# Patient Record
Sex: Female | Born: 1970 | Race: White | Hispanic: No | Marital: Single
Health system: Southern US, Community
[De-identification: ages and names within clinical notes are randomized; demographics above are authoritative.]

---

## 1997-08-29 ENCOUNTER — Ambulatory Visit (HOSPITAL_COMMUNITY): Admission: RE | Admit: 1997-08-29 | Discharge: 1997-08-29 | Payer: Self-pay | Admitting: Obstetrics and Gynecology

## 1998-02-13 ENCOUNTER — Inpatient Hospital Stay (HOSPITAL_COMMUNITY): Admission: AD | Admit: 1998-02-13 | Discharge: 1998-02-13 | Payer: Self-pay | Admitting: Obstetrics and Gynecology

## 1998-05-22 ENCOUNTER — Inpatient Hospital Stay (HOSPITAL_COMMUNITY): Admission: AD | Admit: 1998-05-22 | Discharge: 1998-05-24 | Payer: Self-pay | Admitting: Obstetrics and Gynecology

## 1999-09-23 ENCOUNTER — Other Ambulatory Visit: Admission: RE | Admit: 1999-09-23 | Discharge: 1999-09-23 | Payer: Self-pay | Admitting: Obstetrics and Gynecology

## 2000-01-30 ENCOUNTER — Encounter: Payer: Self-pay | Admitting: Obstetrics and Gynecology

## 2000-01-30 ENCOUNTER — Inpatient Hospital Stay: Admission: AD | Admit: 2000-01-30 | Discharge: 2000-01-30 | Payer: Self-pay | Admitting: Obstetrics and Gynecology

## 2001-01-12 ENCOUNTER — Other Ambulatory Visit: Admission: RE | Admit: 2001-01-12 | Discharge: 2001-01-12 | Payer: Self-pay | Admitting: Obstetrics and Gynecology

## 2001-07-18 ENCOUNTER — Inpatient Hospital Stay (HOSPITAL_COMMUNITY): Admission: AD | Admit: 2001-07-18 | Discharge: 2001-07-20 | Payer: Self-pay | Admitting: Obstetrics and Gynecology

## 2002-09-13 ENCOUNTER — Other Ambulatory Visit: Admission: RE | Admit: 2002-09-13 | Discharge: 2002-09-13 | Payer: Self-pay | Admitting: Obstetrics and Gynecology

## 2003-09-25 ENCOUNTER — Other Ambulatory Visit: Admission: RE | Admit: 2003-09-25 | Discharge: 2003-09-25 | Payer: Self-pay | Admitting: Obstetrics and Gynecology

## 2004-06-13 ENCOUNTER — Encounter: Admission: RE | Admit: 2004-06-13 | Discharge: 2004-06-13 | Payer: Self-pay | Admitting: Family Medicine

## 2004-10-01 ENCOUNTER — Other Ambulatory Visit: Admission: RE | Admit: 2004-10-01 | Discharge: 2004-10-01 | Payer: Self-pay | Admitting: Obstetrics and Gynecology

## 2006-01-04 ENCOUNTER — Other Ambulatory Visit: Admission: RE | Admit: 2006-01-04 | Discharge: 2006-01-04 | Payer: Self-pay | Admitting: Obstetrics and Gynecology

## 2007-01-10 ENCOUNTER — Other Ambulatory Visit: Admission: RE | Admit: 2007-01-10 | Discharge: 2007-01-10 | Payer: Self-pay | Admitting: Obstetrics and Gynecology

## 2008-01-12 ENCOUNTER — Other Ambulatory Visit: Admission: RE | Admit: 2008-01-12 | Discharge: 2008-01-12 | Payer: Self-pay | Admitting: Obstetrics and Gynecology

## 2008-03-08 ENCOUNTER — Other Ambulatory Visit: Admission: RE | Admit: 2008-03-08 | Discharge: 2008-03-08 | Payer: Self-pay | Admitting: Obstetrics and Gynecology

## 2008-11-28 ENCOUNTER — Ambulatory Visit: Payer: Self-pay | Admitting: Sports Medicine

## 2008-11-28 DIAGNOSIS — T148XXA Other injury of unspecified body region, initial encounter: Secondary | ICD-10-CM

## 2008-11-28 DIAGNOSIS — M21619 Bunion of unspecified foot: Secondary | ICD-10-CM

## 2008-11-28 DIAGNOSIS — M25559 Pain in unspecified hip: Secondary | ICD-10-CM

## 2009-01-02 ENCOUNTER — Ambulatory Visit: Payer: Self-pay | Admitting: Sports Medicine

## 2009-02-01 ENCOUNTER — Ambulatory Visit: Payer: Self-pay | Admitting: Sports Medicine

## 2009-02-06 ENCOUNTER — Telehealth: Payer: Self-pay | Admitting: Family Medicine

## 2009-02-08 ENCOUNTER — Ambulatory Visit: Payer: Self-pay | Admitting: Family Medicine

## 2009-02-11 ENCOUNTER — Telehealth: Payer: Self-pay | Admitting: Family Medicine

## 2009-02-18 ENCOUNTER — Telehealth: Payer: Self-pay | Admitting: Family Medicine

## 2009-02-20 ENCOUNTER — Telehealth: Payer: Self-pay | Admitting: Family Medicine

## 2009-02-26 ENCOUNTER — Encounter: Payer: Self-pay | Admitting: Sports Medicine

## 2009-04-24 ENCOUNTER — Ambulatory Visit: Payer: Self-pay | Admitting: Sports Medicine

## 2009-04-24 DIAGNOSIS — M216X9 Other acquired deformities of unspecified foot: Secondary | ICD-10-CM

## 2009-08-29 ENCOUNTER — Ambulatory Visit: Payer: Self-pay | Admitting: Sports Medicine

## 2009-08-29 DIAGNOSIS — M545 Low back pain: Secondary | ICD-10-CM

## 2009-09-26 ENCOUNTER — Encounter (INDEPENDENT_AMBULATORY_CARE_PROVIDER_SITE_OTHER): Payer: Self-pay | Admitting: *Deleted

## 2009-09-26 ENCOUNTER — Encounter: Payer: Self-pay | Admitting: Sports Medicine

## 2009-10-03 ENCOUNTER — Encounter: Payer: Self-pay | Admitting: Sports Medicine

## 2009-10-04 ENCOUNTER — Encounter: Payer: Self-pay | Admitting: Sports Medicine

## 2009-10-08 ENCOUNTER — Ambulatory Visit: Payer: Self-pay | Admitting: Sports Medicine

## 2009-10-08 DIAGNOSIS — M5126 Other intervertebral disc displacement, lumbar region: Secondary | ICD-10-CM

## 2009-10-09 ENCOUNTER — Encounter (INDEPENDENT_AMBULATORY_CARE_PROVIDER_SITE_OTHER): Payer: Self-pay | Admitting: *Deleted

## 2009-10-17 ENCOUNTER — Ambulatory Visit: Payer: Self-pay | Admitting: Sports Medicine

## 2009-10-23 ENCOUNTER — Encounter: Payer: Self-pay | Admitting: Sports Medicine

## 2009-10-25 ENCOUNTER — Encounter: Payer: Self-pay | Admitting: Sports Medicine

## 2009-10-26 ENCOUNTER — Ambulatory Visit (HOSPITAL_COMMUNITY): Admission: RE | Admit: 2009-10-26 | Discharge: 2009-10-26 | Payer: Self-pay | Admitting: Neurosurgery

## 2009-11-14 ENCOUNTER — Encounter: Payer: Self-pay | Admitting: Sports Medicine

## 2010-04-15 ENCOUNTER — Encounter: Payer: Self-pay | Admitting: Sports Medicine

## 2010-04-25 ENCOUNTER — Ambulatory Visit (HOSPITAL_BASED_OUTPATIENT_CLINIC_OR_DEPARTMENT_OTHER): Admission: RE | Admit: 2010-04-25 | Discharge: 2010-04-25 | Payer: Self-pay | Admitting: Orthopedic Surgery

## 2010-08-03 ENCOUNTER — Encounter: Payer: Self-pay | Admitting: Sports Medicine

## 2010-08-14 NOTE — Letter (Signed)
Summary: Out of Work  [facility]  [address]   [place], [place] [address]   Phone: [phone]  Fax: [phone]    October 09, 2009   Employee:  Kathryn Bryant    To Whom It May Concern:   For Medical reasons, please excuse the above named employee from work for the following restrictions:    *No lifting over 10lbs   *Limit the amount of bending over or backwards   *Allow seating often; No continuous standing for longer than 1 hour  If you need additional information, please feel free to contact our office.         Sincerely,   Sibyl Parr. Jettie Booze, M.D Lillia Pauls CMA

## 2010-08-14 NOTE — Miscellaneous (Signed)
Summary: NEW PT APPT  NEW PT APPT IS WITH [facility] ON [address] 3.28.11 AT 2:15. PT TO ARRIVE AT 1:45 FOR NEW PT INFO FORMS TO BE FILLED OUT. [phone]

## 2010-08-14 NOTE — Consult Note (Signed)
Summary: [facility]  [facility]   Imported By: Knox Royalty 10/30/2009 16:02:49  _____________________________________________________________________  External Attachment:    Type:   Image     Comment:   External Document

## 2010-08-14 NOTE — Assessment & Plan Note (Signed)
Summary: 2:45,F/U,MC   Vital Signs:  Patient profile:   40 year old female BP sitting:   108 / 74  Vitals Entered By: Lillia Pauls CMA (October 17, 2009 3:04 PM)  History of Present Illness: Pt presents for follow-up of back pain with radicular symptoms down her right leg. She has had an MRI which showed a herniated disc at L4/L5.  She has taken the prednisone and felt good for the last 2 mornings while she was taking the prednisone but has unfortunately had her pain return. Walking makes her pain worse but she is able to sleep but only with a heating pad. She does get some benefit from the tramadol but has recently run out of it. She has not taken the vicodin and she does not beleive tha tthe neurontin has been helpful because it typically just makes her sleepy.  Overall, she feels like she has not had any improvement since her last office visit.   Allergies: No Known Drug Allergies  Physical Exam  General:  alert and well-developed.   Head:  normocephalic and atraumatic.   Neck:  supple.   Lungs:  normal respiratory effort.   Msk:  Back: No bony abnormalities, edema or bruising Forward flexion to 90 degrees but painful. Can only do about 10-15 degrees of back extension because of pain. 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 Take one tab po qid  Complete Medication List: 1)  Tramadol Hcl 50 Mg Tabs (Tramadol hcl) .... Take one tab po qid 2)  Gabapentin 600 Mg Tabs (Gabapentin) .Marland Kitchen.. 1 by mouth tid  Patient Instructions: 1)  Use the tramadol 4 times daily 2)  Use the higher dose of gabapentin 3 times per day 3)  OK to use supplemental aleve or ibuprofen 4)  heat 5)  activity to tolerance but limit lifting or painful activity Prescriptions: GABAPENTIN 600 MG TABS (GABAPENTIN) 1 by mouth tid  #90 x 2   Entered and Authorized by:   Enid Baas MD   Signed by:   Enid Baas MD on 10/17/2009   Method used:   Electronically to        [facility]. [phone]* (retail)       [address]       [place], [place]  [address]       Ph: [phone]       Fax: [phone]   RxID:   [id number] TRAMADOL HCL 50 MG TABS (TRAMADOL HCL) take one tab po qid  #120 x 2   Entered by:   Jannifer Rodney MD   Authorized by:  Enid Baas MD   Signed by:   Enid Baas MD on 10/17/2009   Method used:   Electronically to        [facility]. [phone]* (retail)       [address]       [place], [place]  [address]       Ph: [phone]       Fax: [phone]   RxID:   [id number]

## 2010-08-14 NOTE — Letter (Signed)
Summary: Out of Work  [facility]  [address]   [place], [place] [address]   Phone: [phone]  Fax: [phone]    October 17, 2009   Employee:  Kathryn Bryant    To Whom It May Concern:   For Medical reasons, please excuse the above named employee from work for the following dates:  Start:   10/17/09  End:   11/15/09  If you need additional information, please feel free to contact our office.         Sincerely,      Enid Baas MD

## 2010-08-14 NOTE — Consult Note (Signed)
Summary: [facility]  [facility]   Imported By: Marily Memos 04/29/2010 15:55:08  _____________________________________________________________________  External Attachment:    Type:   Image     Comment:   External Document

## 2010-08-14 NOTE — Letter (Signed)
Summary: [employer] [facility]  [facility] [facility]   Imported By: Marily Memos 10/03/2009 11:23:49  _____________________________________________________________________  External Attachment:    Type:   Image     Comment:   External Document

## 2010-08-14 NOTE — Consult Note (Signed)
Summary: Kathryn Bryant   Imported By: Marily Memos 11/26/2009 15:41:07  _____________________________________________________________________  External Attachment:    Type:   Image     Comment:   External Document

## 2010-08-14 NOTE — Assessment & Plan Note (Signed)
Summary: LOWER BACK,HIP INJURY,MC   Vital Signs:  Patient profile:   40 year old female Height:      66 inches Weight:      133 pounds BMI:     21.54 BP sitting:   100 / 70  Vitals Entered By: Lillia Pauls CMA (August 29, 2009 9:16 AM)  History of Present Illness: 40 y/o F here for low back pain.  Patient states prior hip issues had improved significantly About 1 1/2 weeks ago was playing 'hug football' at school with kids (hugging to tackle someone) - hugged one of the kids and he kept running - felt a twinge in mid-right low back. Next 2 days went to chiropractor for manipulation and improved - also went last Saturday. Last 2 days working new job on feet constantly and pain seemed worse - difficult to get comfortable Some tingling down into upper right thigh and occasionally into toes on right. Pain worse with bending and extension. No bowel/bladder issues. Took 3 motrin this morning but does not like taking medications.  Allergies (verified): No Known Drug Allergies  Physical Exam  General:  Well-developed,well-nourished,in no acute distress; alert,appropriate and cooperative throughout examination Msk:  Back Exam: Inspection: no gross deformity Motion: FROM but pain at 70 deg flexion and 10 deg extension SLR seated: neg - pain in back only XSLR seated: neg - pain in back only Palpable tenderness: right paraspinal lumbar region with spasm and deep in right buttock FABER: negative.  + piriformis stretch on right. Sensory change: none currently Reflex change: equal in patellar and achilles tendons.  Strength at foot Plantar-flexion: 5 / 5    Dorsi-flexion: 5 / 5    Eversion: 5 / 5   Inversion: 5 / 5 Leg strength Quad: 5 / 5   Hamstring: 5 / 5   Hip flexor: 5 / 5  Gait normal but standing upright leans to left side for comfort.   Impression & Recommendations:  Problem # 1:  LUMBAGO (ICD-724.2) Assessment New Believe patient has discogenic component to low back  pain with radiation into right upper thigh and c/o numbness.  Also with + piriformis stretch but pain originated just right of midline in back.  Start flexion-based stretches and strengthening exercises, to [facility] with modalities as needed.  Neurontin for nerve irritation.  Aleve in addition to this.  Exercise as tolerated - avoid extension which would load discs.  Complete Medication List: 1)  Neurontin 300 Mg Caps (Gabapentin) .... Take 1 tab three times a day  Patient Instructions: 1)  Do the flexion based exercises/stretches as directed. 2)  Start physical therapy 1-2 times a week for 4 weeks. 3)  Take neurontin 300mg  at bedtime - you can increase after a week to twice a day then to three times a day if tolerated to help with nerve irritation. 4)  Aleve 1-2 tabs twice a day with food for pain and inflammation. 5)  Follow up with [place] in 1 month. Prescriptions: NEURONTIN 300 MG CAPS (GABAPENTIN) Take 1 tab three times a day  #90 x 1   Entered and Authorized by:   Norton Blizzard MD   Signed by:   Norton Blizzard MD on 08/29/2009   Method used:   Print then Give to Patient   RxID:   [id number]   Appended Document: LOWER Dorian Heckle discussed by phone that we think is probably discogenic pain; standing is still making worse   OK to keep  on therapeutic dose of advil along with neurontin and if pain not lessening consider stronger med.  REck if worsening.  Appended Document: LOWER BACK,HIP INJURY,MC   Appended Document: LOWER BACK,HIP INJURY,MC APPT FOR MRI IS ON SAT, MARCH 26TH AT 3:30 AT THE [facility] [address]. [id number]. PT INFORMED AND PRIOR AUTH FORM FILLED OUT AND ORDER FAXED  Appended Document: LOWER BACK,HIP INJURY,MC DUE TO THE COST OF THE MRI AND [facility], I CHANGED APPT TO [facility] AT [facility] AT 9:30AM

## 2010-08-14 NOTE — Letter (Signed)
Summary: [facility] PT Referral Form  [facility] PT Referral Form   Imported By: Marily Memos 09/27/2009 08:35:20  _____________________________________________________________________  External Attachment:    Type:   Image     Comment:   External Document

## 2010-08-14 NOTE — Letter (Signed)
Summary: [facility]  [facility]   Imported By: Marily Memos 10/24/2009 10:25:54  _____________________________________________________________________  External Attachment:    Type:   Image     Comment:   External Document

## 2010-08-14 NOTE — Assessment & Plan Note (Signed)
Summary: TO SEE Kathryn Bryant PER Lucifer Soja/MJD   History of Present Illness: Patient is seen in followup of discogenic back pain pain has worsened no real weakness but lots of radicular sxs to RT leg no bowel or bladder probs tingling down RT leg with back extension, flex or leaning to RT  stretches help  not much change w neurontin yet not using tramadol x at nite  MRI shows compression of L5 nerve root and L4/5 disc  here for follow up  Allergies: No Known Drug Allergies  Physical Exam  General:  Well-developed,well-nourished,in no acute distress; alert,appropriate and cooperative throughout examination Msk:  able to do neel, toe and tandem walk sans weakness  pain at < 10 deg back extension pain at 70 deg back flexion no pain on left lean pain on RT lean rotation to left norm but to rt stops just past midline  sLR painful on RT Additional Exam:  MRI reviewed w patient and mother paracentral disk herniation on RT obscures L5 nerve root poor hydration of entire L4/5 disk   Impression & Recommendations:  Problem # 1:  LUMBAGO (ICD-724.2)  Her updated medication list for this problem includes:    Tramadol Hcl 50 Mg Tabs (Tramadol hcl) .Marland Kitchen... 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 Take one qam, one midday, and two qpm 2)  Tramadol Hcl 50 Mg Tabs (Tramadol hcl) .... Take one tab bid 3)  Prednisone 20 Mg Tabs (Prednisone) .... Use 1 by mouth three times a day  Patient Instructions: 1)  neurontin - use 2 three times a day or 600 mgm 3  times per day. 2)  Take tramadol every 4 to 6 hours 3)  find exercises that don't hurt and those are OK 4)  particularly bike or arc machine 5)  very easy motion exercises for back - not to point of pain 6)  keep up knee chest 7)  knee to opp shoulder 8)  buy some therawraps and try these 9)  avoid lifting > 10 lbs 10)  limit bending 11)  trial on prednisone for 1 week - 60 mg per day 12)  reck in 9 days Prescriptions: PREDNISONE 20 MG TABS (PREDNISONE) use 1 by mouth three times a day  #21 x 0   Entered by:   Enid Baas MD   Authorized by:   Norton Blizzard MD   Signed by:   Enid Baas MD on 10/08/2009   Method used:   Electronically to        [facility]. [phone]* (retail)       [address]       [place], [place]  [address]       Ph: [phone]       Fax: [phone]   RxID:   [id number]

## 2010-08-14 NOTE — Letter (Signed)
Summary: [facility]  [facility]   Imported By: Marily Memos 10/08/2009 11:38:43  _____________________________________________________________________  External Attachment:    Type:   Image     Comment:   External Document

## 2010-08-14 NOTE — Letter (Signed)
Summary: [employer] Preauth form  [employer] Preauth form   Imported By: Marily Memos 10/03/2009 15:25:41  _____________________________________________________________________  External Attachment:    Type:   Image     Comment:   External Document

## 2010-09-30 LAB — CBC
HCT: 35.7 % — ABNORMAL LOW (ref 36.0–46.0)
Hemoglobin: 13 g/dL (ref 12.0–15.0)
MCHC: 35.9 g/dL (ref 30.0–36.0)
MCV: 85.9 fL (ref 78.0–100.0)
Platelets: 173 10*3/uL (ref 150–400)
RBC: 4.16 MIL/uL (ref 3.87–5.11)
RDW: 13.5 % (ref 11.5–15.5)
WBC: 7.4 10*3/uL (ref 4.0–10.5)

## 2010-09-30 LAB — SURGICAL PCR SCREEN
MRSA, PCR: NEGATIVE
Staphylococcus aureus: NEGATIVE

## 2014-04-17 ENCOUNTER — Telehealth: Payer: Self-pay | Admitting: *Deleted

## 2014-04-17 NOTE — Telephone Encounter (Signed)
OK to removed from recall.  CC:  Kathryn Bryant

## 2014-04-17 NOTE — Telephone Encounter (Signed)
Recall completed, encounter closed. 

## 2014-04-17 NOTE — Telephone Encounter (Signed)
Called pt to schedule AEX and repeat pap. Recall 8.  Patient states she moved out of town and is seeing somebody else now.  OK to remove from recalls? Dr. Hyacinth MeekerMiller  CC: Kennon RoundsSally

## 2019-09-16 ENCOUNTER — Ambulatory Visit: Payer: Self-pay | Attending: Internal Medicine

## 2019-09-16 DIAGNOSIS — Z23 Encounter for immunization: Secondary | ICD-10-CM | POA: Insufficient documentation

## 2019-09-16 NOTE — Progress Notes (Signed)
   Covid-19 Vaccination Clinic  Name:  Kathryn Bryant    MRN: [id number] DOB: Mar 12, 1971  09/16/2019  [name] was observed post Covid-19 immunization for 15 minutes without incident. She was provided with Vaccine Information Sheet and instruction to access the V-Safe system.   [name] was instructed to call 911 with any severe reactions post vaccine: Marland Kitchen Difficulty breathing  . Swelling of face and throat  . A fast heartbeat  . A bad rash all over body  . Dizziness and weakness   Immunizations Administered    Name Date Dose VIS Date Route   Pfizer COVID-19 Vaccine 09/16/2019 12:14 PM 0.3 mL 06/23/2019 Intramuscular   Manufacturer: [employer], [employer]   Lot: [id number]   NDC: [address]-1

## 2019-10-07 ENCOUNTER — Ambulatory Visit: Payer: Self-pay | Attending: Internal Medicine

## 2019-10-07 DIAGNOSIS — Z23 Encounter for immunization: Secondary | ICD-10-CM

## 2019-10-07 NOTE — Progress Notes (Signed)
   Covid-19 Vaccination Clinic  Name:  Kathryn Bryant    MRN: [id number] DOB: [birth year]  10/07/2019  Ms. Demo was observed post Covid-19 immunization for 15 minutes without incident. She was provided with Vaccine Information Sheet and instruction to access the V-Safe system.   [name] was instructed to call 911 with any severe reactions post vaccine: Marland Kitchen Difficulty breathing  . Swelling of face and throat  . A fast heartbeat  . A bad rash all over body  . Dizziness and weakness   Immunizations Administered    Name Date Dose VIS Date Route   Pfizer COVID-19 Vaccine 10/07/2019  1:38 PM 0.3 mL 06/23/2019 Intramuscular   Manufacturer: [employer], [employer]   Lot: [id number]   NDC: [address]-1

## 2019-10-09 ENCOUNTER — Ambulatory Visit: Payer: Self-pay

## 2019-10-17 ENCOUNTER — Ambulatory Visit: Payer: Self-pay

## 2020-08-09 ENCOUNTER — Other Ambulatory Visit: Payer: Self-pay | Admitting: Gynecology

## 2020-08-09 DIAGNOSIS — N6320 Unspecified lump in the left breast, unspecified quadrant: Secondary | ICD-10-CM

## 2020-08-26 ENCOUNTER — Other Ambulatory Visit: Payer: Self-pay | Admitting: Gynecology

## 2020-08-26 ENCOUNTER — Other Ambulatory Visit: Payer: Self-pay

## 2020-08-26 ENCOUNTER — Ambulatory Visit
Admission: RE | Admit: 2020-08-26 | Discharge: 2020-08-26 | Disposition: A | Discharge status: Home / Self Care | Payer: Managed Care, Other (non HMO) | Source: Ambulatory Visit | Attending: Gynecology | Admitting: Gynecology

## 2020-08-26 DIAGNOSIS — N6320 Unspecified lump in the left breast, unspecified quadrant: Secondary | ICD-10-CM

## 2020-09-19 ENCOUNTER — Other Ambulatory Visit: Payer: Self-pay

## 2021-06-02 ENCOUNTER — Ambulatory Visit (INDEPENDENT_AMBULATORY_CARE_PROVIDER_SITE_OTHER): Payer: 59

## 2021-06-02 ENCOUNTER — Other Ambulatory Visit: Payer: Self-pay

## 2021-06-02 ENCOUNTER — Ambulatory Visit: Payer: 59 | Admitting: Podiatry

## 2021-06-02 DIAGNOSIS — G5792 Unspecified mononeuropathy of left lower limb: Secondary | ICD-10-CM | POA: Diagnosis not present

## 2021-06-02 DIAGNOSIS — F419 Anxiety disorder, unspecified: Secondary | ICD-10-CM | POA: Insufficient documentation

## 2021-06-02 DIAGNOSIS — Z1211 Encounter for screening for malignant neoplasm of colon: Secondary | ICD-10-CM | POA: Insufficient documentation

## 2021-06-02 DIAGNOSIS — M7752 Other enthesopathy of left foot: Secondary | ICD-10-CM | POA: Diagnosis not present

## 2021-06-02 DIAGNOSIS — M21612 Bunion of left foot: Secondary | ICD-10-CM | POA: Diagnosis not present

## 2021-06-02 DIAGNOSIS — R140 Abdominal distension (gaseous): Secondary | ICD-10-CM | POA: Insufficient documentation

## 2021-06-02 DIAGNOSIS — M7750 Other enthesopathy of unspecified foot: Secondary | ICD-10-CM

## 2021-06-02 DIAGNOSIS — M2012 Hallux valgus (acquired), left foot: Secondary | ICD-10-CM | POA: Diagnosis not present

## 2021-06-02 DIAGNOSIS — R194 Change in bowel habit: Secondary | ICD-10-CM | POA: Insufficient documentation

## 2021-06-02 DIAGNOSIS — G4700 Insomnia, unspecified: Secondary | ICD-10-CM | POA: Insufficient documentation

## 2021-06-02 DIAGNOSIS — Z800 Family history of malignant neoplasm of digestive organs: Secondary | ICD-10-CM | POA: Insufficient documentation

## 2021-06-02 NOTE — Progress Notes (Signed)
  Subjective:  Patient ID: Kathryn Bryant, female    DOB: 1971/01/25,  MRN: [id number]  Chief Complaint  Patient presents with   Foot Pain       NP // (Left )  Evaluate foot pain    50 y.o. female presents with the above complaint. History confirmed with patient.  She presents today with left foot pain across the top of the foot that is worse with exercising.  She also has bunions.  Its been present and worsening over the last several weeks.  She is concerned she has a stress fracture  Objective:  Physical Exam: warm, good capillary refill, no trophic changes or ulcerative lesions, normal DP and PT pulses, and normal sensory exam.  She has left foot hallux valgus with a bunion medially.  No reproducible neuritic or joint pain.  Mild tenderness over the abductor hallucis.  No sesamoid pain.   Radiographs: Multiple views x-ray of the left foot: no fracture, dislocation, swelling or degenerative changes noted and no evidence of stress fracture either she does have hallux valgus with a medial bunion sesamoids appear normal in appearance Assessment:   1. Hallux abductovalgus with bunions, left   2. Peripheral neuritis of left foot   3. Tendonitis of ankle or foot      Plan:  Patient was evaluated and treated and all questions answered.  I think most the pain she is having is likely secondary to peripheral neuritis to shoe gear and her pes cavus foot type.  Recommended alternative lacing pattern we also discussed topical medication such as Voltaren.  She does have hallux valgus but this I think likely is not contributing mostly to the etiology although this could explain some of the pain along the abductor and FHP.  No sesamoid pain.  She return to see me as needed for this or other issues if the bunions become more painful we will discuss her further options.  No follow-ups on file.

## 2021-11-27 ENCOUNTER — Other Ambulatory Visit: Payer: Self-pay | Admitting: Gynecology

## 2021-11-27 DIAGNOSIS — N644 Mastodynia: Secondary | ICD-10-CM

## 2021-12-26 ENCOUNTER — Ambulatory Visit
Admission: RE | Admit: 2021-12-26 | Discharge: 2021-12-26 | Disposition: A | Discharge status: Home / Self Care | Payer: 59 | Source: Ambulatory Visit | Attending: Gynecology | Admitting: Gynecology

## 2021-12-26 ENCOUNTER — Ambulatory Visit
Admission: RE | Admit: 2021-12-26 | Discharge: 2021-12-26 | Disposition: A | Discharge status: Home / Self Care | Payer: Self-pay | Source: Ambulatory Visit | Attending: Gynecology | Admitting: Gynecology

## 2021-12-26 DIAGNOSIS — N644 Mastodynia: Secondary | ICD-10-CM

## 2023-10-31 IMAGING — MG MM DIGITAL DIAGNOSTIC UNILAT*L* W/ TOMO W/ CAD
8 series · 8 of 24 positions shown · non-contrast
Comparison: Previous exam(s).

CLINICAL DATA: Patient complains thickening

EXAM:
DIGITAL DIAGNOSTIC UNILATERAL LEFT MAMMOGRAM WITH TOMOSYNTHESIS AND
CAD; ULTRASOUND LEFT BREAST LIMITED
TECHNIQUE: Left digital diagnostic mammography and breast tomosynthesis was
performed. The images were evaluated with computer-aided detection.;
Targeted ultrasound examination of the left breast was performed.

[L CC synth-2D (1 of 2)]
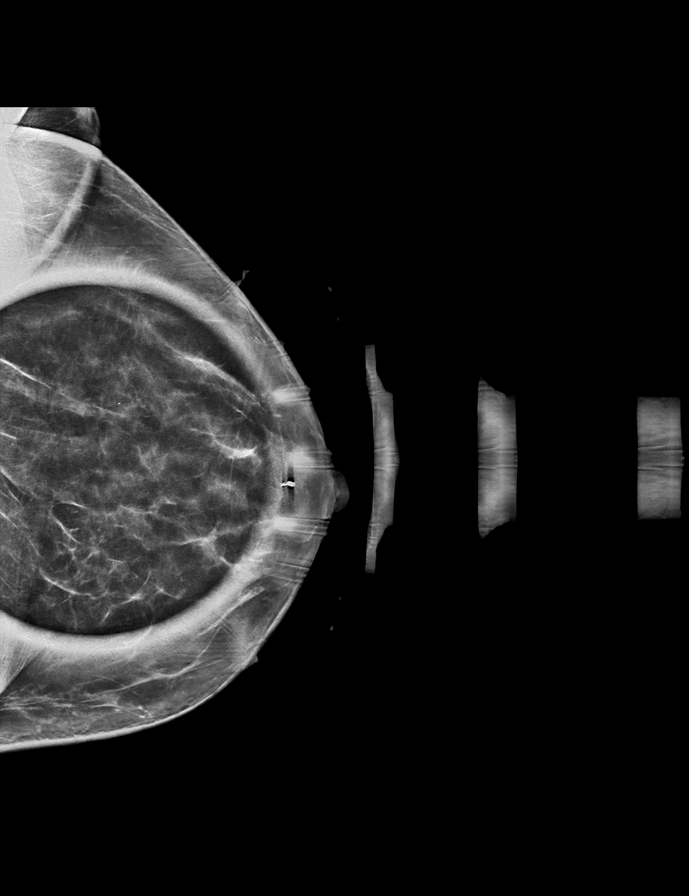

[L CC synth-2D (2 of 2)]
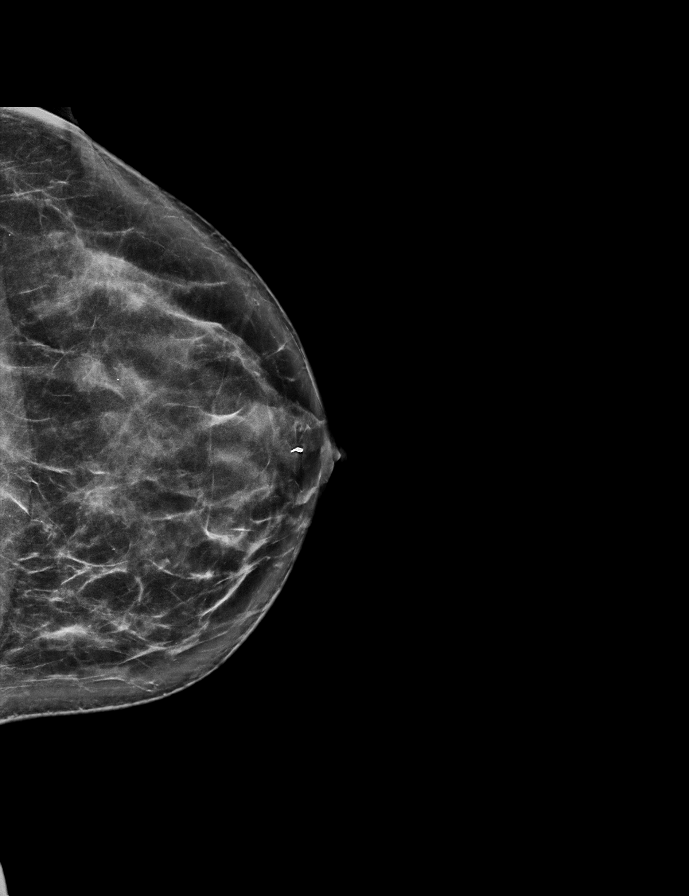

[L MLO synth-2D (1 of 2)]
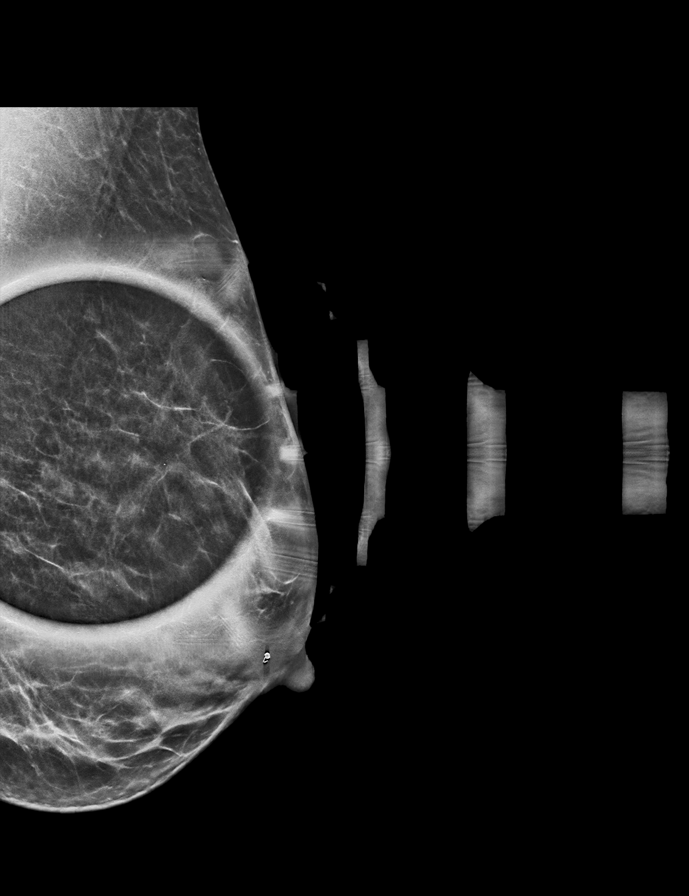

[L MLO synth-2D (2 of 2)]
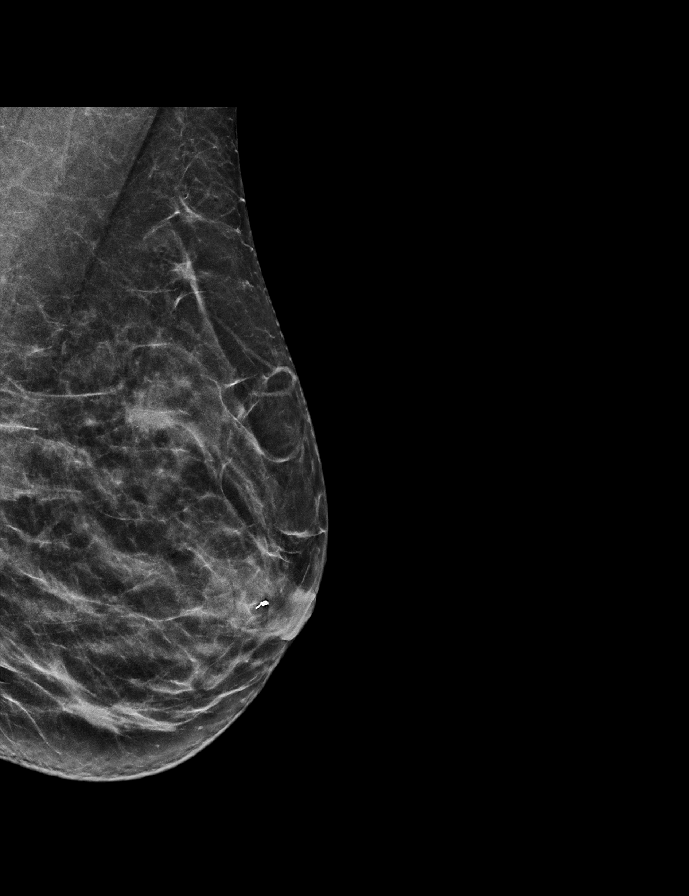

[L MLO tomo (1 of 2) · tomo slice 23/46.0]
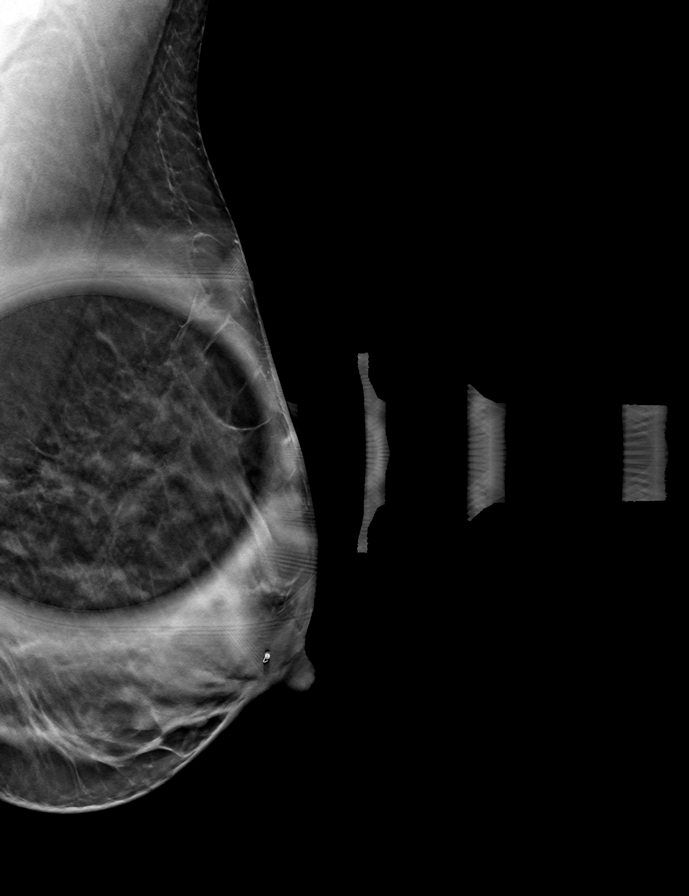

[L MLO tomo (2 of 2) · tomo slice 27/52.0]
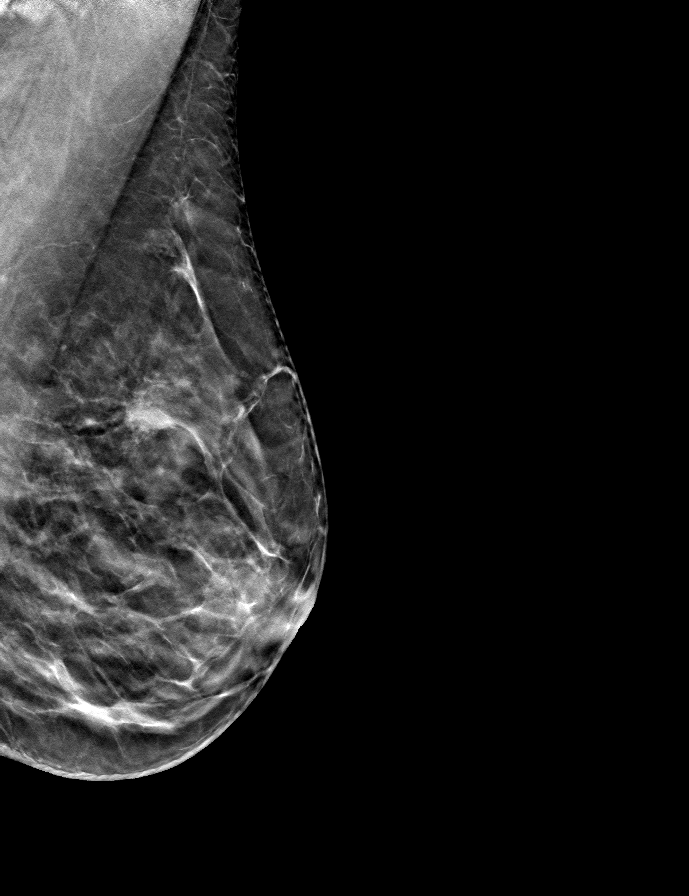

[L CC tomo (1 of 2) · tomo slice 33/65.0]
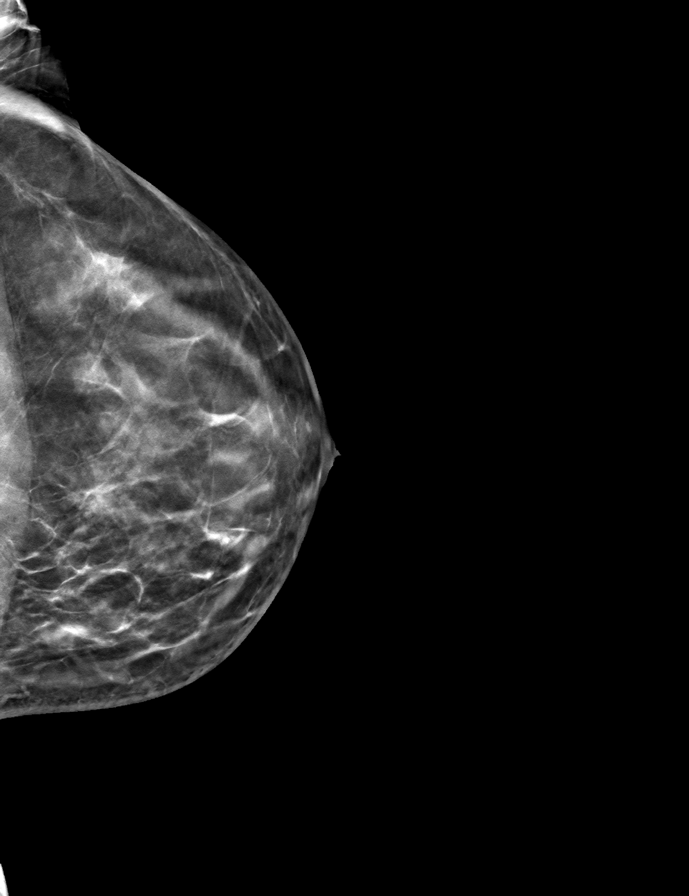

[L CC tomo (2 of 2) · tomo slice 29/58.0]
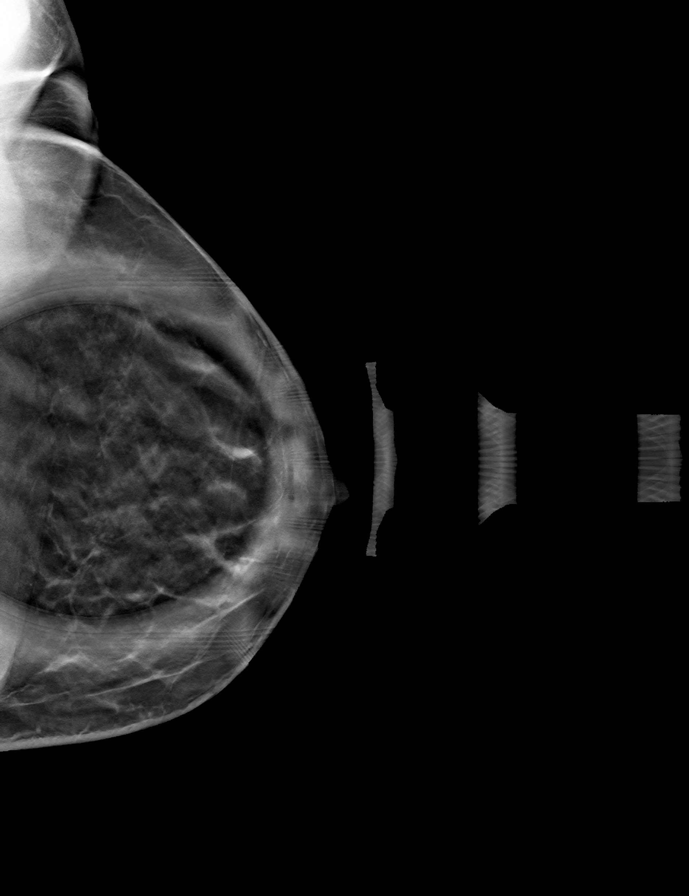

[8 of 24 positions shown; findings below may reference images not displayed]

ACR Breast Density Category c: The breast tissue is heterogeneously
dense, which may obscure small masses.
FINDINGS: Cc and MLO views of the left breast, spot compression cc and MLO
views of the left breast are submitted. There is questioned
asymmetry in the upper-outer quadrant left breast which does not
persist on spot compression views and is due to superimposed breast
parenchyma. No suspicious abnormalities identified in the left
breast.

Targeted ultrasound is performed, showing no focal abnormal discrete
cystic or solid lesion in focal area of pain and thickening at left
breast lateral lower quadrant.
IMPRESSION: Negative.

RECOMMENDATION:
Routine screening mammogram back on schedule.

I have discussed the findings and recommendations with the patient.
If applicable, a reminder letter will be sent to the patient
regarding the next appointment.

BI-RADS CATEGORY  1: Negative.

## 2024-07-21 ENCOUNTER — Other Ambulatory Visit (HOSPITAL_BASED_OUTPATIENT_CLINIC_OR_DEPARTMENT_OTHER): Payer: Self-pay | Admitting: Family Medicine

## 2024-07-21 DIAGNOSIS — E7800 Pure hypercholesterolemia, unspecified: Secondary | ICD-10-CM

## 2024-08-09 ENCOUNTER — Ambulatory Visit (HOSPITAL_BASED_OUTPATIENT_CLINIC_OR_DEPARTMENT_OTHER)

## 2024-08-16 ENCOUNTER — Ambulatory Visit (HOSPITAL_BASED_OUTPATIENT_CLINIC_OR_DEPARTMENT_OTHER)

## 2024-08-22 ENCOUNTER — Ambulatory Visit (HOSPITAL_BASED_OUTPATIENT_CLINIC_OR_DEPARTMENT_OTHER): Payer: Self-pay
